# Patient Record
Sex: Female | Born: 1960 | Race: Black or African American | Hispanic: No | Smoking: Never smoker
Health system: Southern US, Community
[De-identification: ages and names within clinical notes are randomized; demographics above are authoritative.]

## PROBLEM LIST (undated history)

## (undated) DIAGNOSIS — J45909 Unspecified asthma, uncomplicated: Secondary | ICD-10-CM

## (undated) DIAGNOSIS — E669 Obesity, unspecified: Secondary | ICD-10-CM

---

## 2011-11-07 ENCOUNTER — Emergency Department (HOSPITAL_COMMUNITY)
Admission: EM | Admit: 2011-11-07 | Discharge: 2011-11-07 | Disposition: A | Discharge status: Home / Self Care | Payer: Self-pay | Attending: Emergency Medicine | Admitting: Emergency Medicine

## 2011-11-07 ENCOUNTER — Encounter (HOSPITAL_COMMUNITY): Payer: Self-pay | Admitting: Emergency Medicine

## 2011-11-07 DIAGNOSIS — R42 Dizziness and giddiness: Secondary | ICD-10-CM | POA: Insufficient documentation

## 2011-11-07 DIAGNOSIS — Z79899 Other long term (current) drug therapy: Secondary | ICD-10-CM | POA: Insufficient documentation

## 2011-11-07 DIAGNOSIS — R110 Nausea: Secondary | ICD-10-CM | POA: Insufficient documentation

## 2011-11-07 DIAGNOSIS — E119 Type 2 diabetes mellitus without complications: Secondary | ICD-10-CM | POA: Insufficient documentation

## 2011-11-07 DIAGNOSIS — R51 Headache: Secondary | ICD-10-CM | POA: Insufficient documentation

## 2011-11-07 DIAGNOSIS — H538 Other visual disturbances: Secondary | ICD-10-CM | POA: Insufficient documentation

## 2011-11-07 DIAGNOSIS — I10 Essential (primary) hypertension: Secondary | ICD-10-CM

## 2011-11-07 HISTORY — DX: Obesity, unspecified: E66.9

## 2011-11-07 LAB — POCT I-STAT, CHEM 8
Calcium, Ion: 1.18 mmol/L (ref 1.12–1.32)
HCT: 39 % (ref 36.0–46.0)
TCO2: 26 mmol/L (ref 0–100)

## 2011-11-07 MED ORDER — METOPROLOL SUCCINATE ER 25 MG PO TB24: 100.0000 mg | ORAL_TABLET | Freq: Every day | ORAL | Status: DC

## 2011-11-07 MED ORDER — HYDROCHLOROTHIAZIDE 25 MG PO TABS: 25.0000 mg | ORAL_TABLET | Freq: Every day | ORAL | Status: DC

## 2011-11-07 NOTE — ED Notes (Signed)
Pt is alert and oriented x 4 with respirations even and unlabored.  NAD at this time.  Discharge instructions reviewed with patient and patient verbalized understanding.  Pt ambulated to lobby with steady gait.

## 2011-11-07 NOTE — ED Notes (Signed)
CSW and pt advocate at bedside.

## 2011-11-07 NOTE — ED Notes (Signed)
ZOX:[id number]<AV> Expected date:<BR> Expected time: 3:14 PM<BR> Means of arrival:<BR> Comments:<BR> M31 - 50yoF Hypertensive earlier, nausea now

## 2011-11-07 NOTE — ED Provider Notes (Signed)
History     CSN: [id number]  Arrival date & time 11/07/11  1514   First MD Initiated Contact with Patient 11/07/11 1537      Chief Complaint  Patient presents with  . Dizziness  . Nausea     HPI Patient presents with dizziness, blurred vision, headache.  She took her blood pressure and was very elevated.  She took her sister blood pressure medicine.  Now she feels back to baseline.  Her blood pressure has decreased.  She does not have a regular physician.  This is a gradual onset of symptoms. Past Medical History  Diagnosis Date  . Obesity   . Diabetes mellitus     diet managed    History reviewed. No pertinent past surgical history.  No family history on file.  History  Substance Use Topics  . Smoking status: Never Smoker   . Smokeless tobacco: Not on file  . Alcohol Use: No    OB History    Grav Para Term Preterm Abortions TAB SAB Ect Mult Living                  Review of Systems Negative except as noted in history of present illness Allergies  Review of patient's allergies indicates no known allergies.  Home Medications   Current Outpatient Rx  Name Route Sig Dispense Refill  . ACETAMINOPHEN 500 MG PO TABS Oral Take 500 mg by mouth every 6 (six) hours as needed. For pain management    . ALBUTEROL SULFATE 1.25 MG/3ML IN NEBU Nebulization Take 1 ampule by nebulization every 6 (six) hours as needed. For breathing treatment    . IPRATROPIUM-ALBUTEROL 18-103 MCG/ACT IN AERO Inhalation Inhale 2 puffs into the lungs every 6 (six) hours as needed. For breathing    . METOPROLOL SUCCINATE ER 100 MG PO TB24 Oral Take 100 mg by mouth daily. Take with or immediately following a meal.    . HYDROCHLOROTHIAZIDE 25 MG PO TABS Oral Take 1 tablet (25 mg total) by mouth daily. 30 tablet 0  . METOPROLOL SUCCINATE ER 25 MG PO TB24 Oral Take 4 tablets (100 mg total) by mouth daily. 30 tablet 0    BP 141/86  Pulse 80  Temp(Src) 98.3 F (36.8 C) (Oral)  Resp 16  Ht 5\' 9"   (1.753 m)  SpO2 100%  LMP 10/24/2011  Physical Exam  Nursing note and vitals reviewed. Constitutional: She is oriented to person, place, and time. She appears well-developed and well-nourished. No distress.  HENT:  Head: Normocephalic and atraumatic.  Eyes: Pupils are equal, round, and reactive to light.       No papilledema noted  Neck: Normal range of motion.  Cardiovascular: Normal rate and intact distal pulses.   Pulmonary/Chest: No respiratory distress.  Abdominal: Normal appearance. She exhibits no distension.  Musculoskeletal: Normal range of motion.  Neurological: She is alert and oriented to person, place, and time. No cranial nerve deficit.  Skin: Skin is warm and dry. No rash noted.  Psychiatric: She has a normal mood and affect. Her behavior is normal.    ED Course  Procedures (including critical care time)  Labs Reviewed  POCT I-STAT, CHEM 8 - Abnormal; Notable for the following:    Glucose, Bld 151 (*)    All other components within normal limits  I-STAT, CHEM 8   No results found.   1. Hypertension       MDM         Nelia Shi,  MD 11/07/11 1746

## 2011-11-07 NOTE — ED Notes (Signed)
Per Pt: BP has been 200+ systolic for past few days; Pt not previously diagnosed with HTN. Took 100mg  of Lopressor from relative at noon. Did have headache and dizziness, now resolved.

## 2012-11-05 ENCOUNTER — Encounter (HOSPITAL_COMMUNITY): Payer: Self-pay | Admitting: Emergency Medicine

## 2012-11-05 ENCOUNTER — Emergency Department (INDEPENDENT_AMBULATORY_CARE_PROVIDER_SITE_OTHER)
Admission: EM | Admit: 2012-11-05 | Discharge: 2012-11-05 | Disposition: A | Discharge status: Home / Self Care | Payer: Self-pay | Source: Home / Self Care

## 2012-11-05 DIAGNOSIS — J45909 Unspecified asthma, uncomplicated: Secondary | ICD-10-CM

## 2012-11-05 DIAGNOSIS — I10 Essential (primary) hypertension: Secondary | ICD-10-CM

## 2012-11-05 HISTORY — DX: Unspecified asthma, uncomplicated: J45.909

## 2012-11-05 MED ORDER — IPRATROPIUM-ALBUTEROL 18-103 MCG/ACT IN AERO: 2.0000 | INHALATION_SPRAY | Freq: Four times a day (QID) | RESPIRATORY_TRACT | Status: DC | PRN

## 2012-11-05 MED ORDER — METOPROLOL TARTRATE 50 MG PO TABS: ORAL_TABLET | ORAL | Status: DC

## 2012-11-05 MED ORDER — BUDESONIDE-FORMOTEROL FUMARATE 80-4.5 MCG/ACT IN AERO: 2.0000 | INHALATION_SPRAY | Freq: Two times a day (BID) | RESPIRATORY_TRACT | Status: DC

## 2012-11-05 NOTE — ED Notes (Signed)
Pt. taken to [facility] to make appointment.

## 2012-11-05 NOTE — ED Notes (Signed)
Out of bp medicine and symbicort

## 2012-11-05 NOTE — ED Provider Notes (Signed)
History     CSN: [id number]  Arrival date & time 11/05/12  1258   None     Chief Complaint  Patient presents with  . 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 She is requesting refills for metoprolol tartrate 50 mg twice a day, Symbicort inhaler and Combivent inhaler. She has a history of hypertension and asthma. She states that she still has some wheezing after she had a cold last week.   Past Medical History  Diagnosis Date  . Obesity   . Diabetes mellitus     diet managed  . Asthma     History reviewed. No pertinent past surgical history.  No family history on file.  History  Substance Use Topics  . Smoking status: Never Smoker   . Smokeless tobacco: Not on file  . Alcohol Use: No    OB History    Grav Para Term Preterm Abortions TAB SAB Ect Mult Living                  Review of Systems  Constitutional: Negative.   HENT: Negative.   Respiratory: Positive for wheezing.   Cardiovascular: Negative for chest pain, palpitations and leg swelling.  Gastrointestinal: Negative.   Genitourinary: Negative.   Hematological: Negative.     Allergies  Review of patient's allergies indicates no known allergies.  Home Medications   Current Outpatient Rx  Name  Route  Sig  Dispense  Refill  . SYMBICORT IN   Inhalation   Inhale into the lungs.         Marland Kitchen HYDROCHLOROTHIAZIDE 25 MG PO TABS   Oral   Take 1 tablet (25 mg total) by mouth daily.   30 tablet   0   . METOPROLOL TARTRATE 50 MG PO TABS   Oral   Take 50 mg by mouth 2 (two) times daily.         . ACETAMINOPHEN 500 MG PO TABS   Oral   Take 500 mg by mouth every 6 (six) hours as needed. For pain management         . ALBUTEROL SULFATE 1.25 MG/3ML IN NEBU   Nebulization   Take 1 ampule  by nebulization every 6 (six) hours as needed. For breathing treatment         . IPRATROPIUM-ALBUTEROL 18-103 MCG/ACT IN AERO   Inhalation   Inhale 2 puffs into the lungs every 6 (six) hours as needed. For breathing         . IPRATROPIUM-ALBUTEROL 18-103 MCG/ACT IN AERO   Inhalation   Inhale 2 puffs into the lungs every 6 (six) hours as needed for wheezing.   1 Inhaler   2   . BUDESONIDE-FORMOTEROL FUMARATE 80-4.5 MCG/ACT IN AERO   Inhalation   Inhale 2 puffs into the lungs 2 (two) times daily.   1 Inhaler   1   . METOPROLOL TARTRATE 50 MG PO TABS      1 tab bid   60 tablet   0   . METOPROLOL SUCCINATE ER 100 MG PO TB24   Oral   Take 100 mg by mouth daily. Take with or immediately following a meal.         . METOPROLOL SUCCINATE ER 25 MG PO TB24   Oral   Take 4 tablets (100 mg total) by mouth daily.  30 tablet   0     BP 136/71  Pulse 91  Temp 98.2 F (36.8 C) (Oral)  Resp 20  SpO2 95%  Physical Exam  Nursing note and vitals reviewed. Constitutional: She is oriented to person, place, and time. She appears well-developed. No distress.  Eyes: Conjunctivae normal and EOM are normal.  Neck: Normal range of motion. Neck supple.  Cardiovascular: Normal rate, regular rhythm and normal heart sounds.   Pulmonary/Chest: Effort normal. She has wheezes. She has no rales.  Musculoskeletal: Normal range of motion. She exhibits no edema.  Lymphadenopathy:    She has no cervical adenopathy.  Neurological: She is alert and oriented to person, place, and time.  Skin: Skin is warm and dry.  Psychiatric: She has a normal mood and affect.    ED Course  Procedures (including critical care time)  Labs Reviewed - No data to display No results found.   1. HTN (hypertension)   2. Asthma, intrinsic       MDM  Process Ms. Deandrade in obtaining an appointment for the adult care center prior to leaving. Rx for metoprolol tartrate 50 mg twice a day #60 Symbicort 2  puffs twice a day Combivent 1-2 puffs every 6 hours when necessary Followup with the PCP as stated above as soon as possible. For any worsening new symptoms or problems may return         Hayden Rasmussen, NP 11/05/12 1513

## 2012-11-06 NOTE — ED Provider Notes (Signed)
Medical screening examination/treatment/procedure(s) were performed by resident physician or non-physician practitioner and as supervising physician I was immediately available for consultation/collaboration.   Orlie Cundari DOUGLAS MD.    Shevon Sian D Orestes Geiman, MD 11/06/12 1058 

## 2012-11-18 ENCOUNTER — Emergency Department (HOSPITAL_COMMUNITY): Payer: Self-pay

## 2012-11-18 ENCOUNTER — Encounter (HOSPITAL_COMMUNITY): Payer: Self-pay | Admitting: *Deleted

## 2012-11-18 ENCOUNTER — Emergency Department (HOSPITAL_COMMUNITY)
Admission: EM | Admit: 2012-11-18 | Discharge: 2012-11-18 | Disposition: A | Discharge status: Home / Self Care | Payer: Self-pay | Attending: Emergency Medicine | Admitting: Emergency Medicine

## 2012-11-18 DIAGNOSIS — R739 Hyperglycemia, unspecified: Secondary | ICD-10-CM

## 2012-11-18 DIAGNOSIS — Z79899 Other long term (current) drug therapy: Secondary | ICD-10-CM | POA: Insufficient documentation

## 2012-11-18 DIAGNOSIS — J40 Bronchitis, not specified as acute or chronic: Secondary | ICD-10-CM

## 2012-11-18 DIAGNOSIS — J45901 Unspecified asthma with (acute) exacerbation: Secondary | ICD-10-CM | POA: Insufficient documentation

## 2012-11-18 DIAGNOSIS — J209 Acute bronchitis, unspecified: Secondary | ICD-10-CM | POA: Insufficient documentation

## 2012-11-18 DIAGNOSIS — E1169 Type 2 diabetes mellitus with other specified complication: Secondary | ICD-10-CM | POA: Insufficient documentation

## 2012-11-18 DIAGNOSIS — E669 Obesity, unspecified: Secondary | ICD-10-CM | POA: Insufficient documentation

## 2012-11-18 LAB — CBC
MCH: 29.3 pg (ref 26.0–34.0)
Platelets: 366 10*3/uL (ref 150–400)
RBC: 4.58 MIL/uL (ref 3.87–5.11)
WBC: 7.8 10*3/uL (ref 4.0–10.5)

## 2012-11-18 LAB — BASIC METABOLIC PANEL
Calcium: 9.5 mg/dL (ref 8.4–10.5)
GFR calc non Af Amer: >90 mL/min (ref >90)
Sodium: 132 mEq/L — ABNORMAL LOW (ref 135–145)

## 2012-11-18 LAB — GLUCOSE, CAPILLARY: Glucose-Capillary: 299 mg/dL — ABNORMAL HIGH (ref 70–99)

## 2012-11-18 MED ORDER — IPRATROPIUM BROMIDE 0.02 % IN SOLN
0.5000 mg | Freq: Once | RESPIRATORY_TRACT | Status: AC
  Administered 2012-11-18: 0.5 mg via RESPIRATORY_TRACT
  Filled 2012-11-18: qty 2.5

## 2012-11-18 MED ORDER — PREDNISONE 20 MG PO TABS
60.0000 mg | ORAL_TABLET | Freq: Once | ORAL | Status: AC
  Administered 2012-11-18: 60 mg via ORAL
  Filled 2012-11-18: qty 3

## 2012-11-18 MED ORDER — IPRATROPIUM-ALBUTEROL 18-103 MCG/ACT IN AERO: 2.0000 | INHALATION_SPRAY | RESPIRATORY_TRACT | Status: DC | PRN

## 2012-11-18 MED ORDER — AZITHROMYCIN 250 MG PO TABS: ORAL_TABLET | ORAL | Status: DC

## 2012-11-18 MED ORDER — INSULIN ASPART 100 UNIT/ML ~~LOC~~ SOLN
6.0000 [IU] | Freq: Once | SUBCUTANEOUS | Status: AC
  Administered 2012-11-18: 6 [IU] via INTRAVENOUS
  Filled 2012-11-18: qty 1

## 2012-11-18 MED ORDER — ALBUTEROL SULFATE (5 MG/ML) 0.5% IN NEBU
5.0000 mg | INHALATION_SOLUTION | Freq: Once | RESPIRATORY_TRACT | Status: AC
  Administered 2012-11-18: 5 mg via RESPIRATORY_TRACT
  Filled 2012-11-18: qty 1

## 2012-11-18 MED ORDER — SODIUM CHLORIDE 0.9 % IV BOLUS (SEPSIS): 1000.0000 mL | Freq: Once | INTRAVENOUS | Status: DC

## 2012-11-18 MED ORDER — PREDNISONE 20 MG PO TABS: ORAL_TABLET | ORAL | Status: DC

## 2012-11-18 MED ORDER — ALBUTEROL SULFATE (2.5 MG/3ML) 0.083% IN NEBU: 2.5000 mg | INHALATION_SOLUTION | Freq: Four times a day (QID) | RESPIRATORY_TRACT | Status: DC | PRN

## 2012-11-18 MED ORDER — AZITHROMYCIN 250 MG PO TABS
500.0000 mg | ORAL_TABLET | Freq: Once | ORAL | Status: AC
  Administered 2012-11-18: 500 mg via ORAL
  Filled 2012-11-18: qty 2

## 2012-11-18 NOTE — ED Notes (Signed)
Patient transported to X-ray 

## 2012-11-18 NOTE — ED Notes (Signed)
Pt with hx of asthma to ED c/o sob, coughing up blood and "fluid", chest tightness/pain since Tues.  Pt only has steroid inhaler at home.  Has run out of albuterol brthng tx.  Exp wheezes noted.

## 2012-11-18 NOTE — ED Notes (Signed)
C/o worsening SOB & wheezing since Monday. Has Hx asthma, reports ran out of albuterol inhaler 1 month ago & med for nebulizer 2 months ago. Also c/o chills, upper back pack pain & chest tightness. Presently resp e/u, no distress. Resp rate 22/min, POX >95% RA

## 2012-11-18 NOTE — ED Provider Notes (Signed)
History     CSN: [id number]  Arrival date & time 11/18/12  1143   First MD Initiated Contact with Patient 11/18/12 1158      Chief Complaint  Patient presents with  . Asthma  . Shortness of Breath    (Consider location/radiation/quality/duration/timing/severity/associated sxs/prior treatment) HPI Elizabeth Leblanc is a 52 y.o. female who presents to ED with complaint of cough, wheezing, chest tightness. Symptoms for 3-4 days, worsened last night. States has hx of asthma. No fever. States using combivent inhaler multiple times a day with no relief. States having pink sputum that she is coughing up. States started with a cold symptoms. Deneis neck pain, stiffness, no other URI symptoms at this time. No leg swelling. Is feeling short of breath.    Past Medical History  Diagnosis Date  . Obesity   . Diabetes mellitus     diet managed  . Asthma     History reviewed. No pertinent past surgical history.  No family history on file.  History  Substance Use Topics  . Smoking status: Never Smoker   . Smokeless tobacco: Not on file  . Alcohol Use: No    OB History    Grav Para Term Preterm Abortions TAB SAB Ect Mult Living                  Review of Systems  Constitutional: Negative for fever and chills.  HENT: Negative for congestion, sore throat, neck pain and neck stiffness.   Respiratory: Positive for cough, chest tightness, shortness of breath and wheezing.   Cardiovascular: Negative for palpitations and leg swelling.  Gastrointestinal: Negative for nausea, vomiting, abdominal pain and diarrhea.  Musculoskeletal: Negative for myalgias.  Skin: Negative.   Neurological: Negative for dizziness, weakness, numbness and headaches.  All other systems reviewed and are negative.    Allergies  Review of patient's allergies indicates no known allergies.  Home Medications   Current Outpatient Rx  Name  Route  Sig  Dispense  Refill  . ACETAMINOPHEN 500 MG PO TABS   Oral    Take 500 mg by mouth every 6 (six) hours as needed. For pain management         . ALBUTEROL SULFATE 1.25 MG/3ML IN NEBU   Nebulization   Take 1 ampule by nebulization every 6 (six) hours as needed. For breathing treatment         . IPRATROPIUM-ALBUTEROL 18-103 MCG/ACT IN AERO   Inhalation   Inhale 2 puffs into the lungs every 6 (six) hours as needed. For breathing         . IPRATROPIUM-ALBUTEROL 18-103 MCG/ACT IN AERO   Inhalation   Inhale 2 puffs into the lungs every 6 (six) hours as needed for wheezing.   1 Inhaler   2   . BUDESONIDE-FORMOTEROL FUMARATE 80-4.5 MCG/ACT IN AERO   Inhalation   Inhale 2 puffs into the lungs 2 (two) times daily.   1 Inhaler   1   . SYMBICORT IN   Inhalation   Inhale into the lungs.         Marland Kitchen HYDROCHLOROTHIAZIDE 25 MG PO TABS   Oral   Take 1 tablet (25 mg total) by mouth daily.   30 tablet   0   . METOPROLOL TARTRATE 50 MG PO TABS   Oral   Take 50 mg by mouth 2 (two) times daily.         Marland Kitchen METOPROLOL TARTRATE 50 MG PO TABS  1 tab bid   60 tablet   0   . METOPROLOL SUCCINATE ER 100 MG PO TB24   Oral   Take 100 mg by mouth daily. Take with or immediately following a meal.         . METOPROLOL SUCCINATE ER 25 MG PO TB24   Oral   Take 4 tablets (100 mg total) by mouth daily.   30 tablet   0     BP 127/80  Pulse 101  Temp 98 F (36.7 C) (Oral)  Resp 22  SpO2 96%  Physical Exam  Nursing note and vitals reviewed. Constitutional: She appears well-developed and well-nourished. No distress.       obese  Eyes: Conjunctivae normal are normal.  Cardiovascular: Normal rate, regular rhythm and normal heart sounds.   Pulmonary/Chest: Effort normal. She has wheezes. She has no rales. She exhibits no tenderness.       Inspiratory and expiratory wheezes bilaterally  Abdominal: Soft. Bowel sounds are normal. She exhibits no distension. There is no tenderness. There is no rebound.  Musculoskeletal: She exhibits no edema.   Neurological: She is alert.  Skin: Skin is warm and dry.    ED Course  Procedures (including critical care time)  Results for orders placed during the hospital encounter of 11/18/12  BASIC METABOLIC PANEL      Component Value Range   Sodium 132 (*) 135 - 145 mEq/L   Potassium 4.0  3.5 - 5.1 mEq/L   Chloride 96  96 - 112 mEq/L   CO2 26  19 - 32 mEq/L   Glucose, Bld 341 (*) 70 - 99 mg/dL   BUN 13  6 - 23 mg/dL   Creatinine, Ser 1.61  0.50 - 1.10 mg/dL   Calcium 9.5  8.4 - 09.6 mg/dL   GFR calc non Af Amer >90  >90 mL/min   GFR calc Af Amer >90  >90 mL/min  CBC      Component Value Range   WBC 7.8  4.0 - 10.5 K/uL   RBC 4.58  3.87 - 5.11 MIL/uL   Hemoglobin 13.4  12.0 - 15.0 g/dL   HCT 04.5  40.9 - 81.1 %   MCV 87.1  78.0 - 100.0 fL   MCH 29.3  26.0 - 34.0 pg   MCHC 33.6  30.0 - 36.0 g/dL   RDW 91.4  78.2 - 95.6 %   Platelets 366  150 - 400 K/uL  GLUCOSE, CAPILLARY      Component Value Range   Glucose-Capillary 299 (*) 70 - 99 mg/dL   Comment 1 Notify RN     Comment 2 Documented in Chart     Dg Chest 2 View (if Patient Has Fever And/or Copd)  11/18/2012  *RADIOLOGY REPORT*  Clinical Data: Asthma with shortness of breath  CHEST - 2 VIEW  Comparison: None.  Findings:  Lungs clear.  Heart size and pulmonary vascularity are normal.  No adenopathy.  No bone lesions.  IMPRESSION: No abnormality noted.   Original Report Authenticated By: Bretta Bang, M.D.      1. Bronchitis   2. Asthma exacerbation   3. Hyperglycemia       MDM  Pt with cough, URI symptoms, wheezing. She has not been taking her inhaler often, because running out. She is coughing, productive cough. Wheezes in all lung fields. Nebs started. CXR negative. Suspect bronchitis. Treated in ED with albuterol neb, prednisone given. Pt feeling better. Her blood sugar elevated at 341.  Pt admits to not taking her metformin the last few days. Explained importance of taking it. Her VS normal at d/c. Got Falecia to come  talk to pt about follow up. Given resources. Given a free prescription for combivent. She is to follow up outpatient.    Filed Vitals:   11/18/12 1153 11/18/12 1220 11/18/12 1415 11/18/12 1515  BP: 127/80 114/73 117/83 120/92  Pulse: 101 103 102 102  Temp: 98 F (36.7 C)  98.3 F (36.8 C)   TempSrc: Oral  Oral   Resp: 22 23 20 19   SpO2: 96% 95% 97% 96%        Lottie Mussel, PA 11/18/12 1540

## 2012-11-22 NOTE — ED Provider Notes (Signed)
Medical screening examination/treatment/procedure(s) were performed by non-physician practitioner and as supervising physician I was immediately available for consultation/collaboration.   Aaniyah Strohm E Dylen Mcelhannon, MD 11/22/12 0752 

## 2012-12-27 ENCOUNTER — Telehealth (HOSPITAL_COMMUNITY): Payer: Self-pay

## 2013-01-14 ENCOUNTER — Emergency Department (INDEPENDENT_AMBULATORY_CARE_PROVIDER_SITE_OTHER)
Admission: EM | Admit: 2013-01-14 | Discharge: 2013-01-14 | Disposition: A | Discharge status: Home / Self Care | Payer: Self-pay | Source: Home / Self Care

## 2013-01-14 ENCOUNTER — Encounter (HOSPITAL_COMMUNITY): Payer: Self-pay

## 2013-01-14 DIAGNOSIS — J45909 Unspecified asthma, uncomplicated: Secondary | ICD-10-CM

## 2013-01-14 LAB — POCT I-STAT, CHEM 8
BUN: 17 mg/dL (ref 6–23)
Chloride: 103 mEq/L (ref 96–112)
Creatinine, Ser: 0.6 mg/dL (ref 0.50–1.10)
Glucose, Bld: 293 mg/dL — ABNORMAL HIGH (ref 70–99)
Potassium: 4.1 mEq/L (ref 3.5–5.1)

## 2013-01-14 MED ORDER — METOPROLOL TARTRATE 25 MG PO TABS: 25.0000 mg | ORAL_TABLET | Freq: Two times a day (BID) | ORAL | Status: DC

## 2013-01-14 MED ORDER — IPRATROPIUM-ALBUTEROL 18-103 MCG/ACT IN AERO: 2.0000 | INHALATION_SPRAY | Freq: Four times a day (QID) | RESPIRATORY_TRACT | Status: AC | PRN

## 2013-01-14 MED ORDER — ALBUTEROL SULFATE 1.25 MG/3ML IN NEBU: 1.0000 | INHALATION_SOLUTION | Freq: Four times a day (QID) | RESPIRATORY_TRACT | Status: DC | PRN

## 2013-01-14 MED ORDER — METFORMIN HCL 1000 MG PO TABS: 1000.0000 mg | ORAL_TABLET | Freq: Two times a day (BID) | ORAL | Status: DC

## 2013-01-14 MED ORDER — PREDNISONE 20 MG PO TABS: ORAL_TABLET | ORAL | Status: AC

## 2013-01-14 MED ORDER — HYDROCHLOROTHIAZIDE 25 MG PO TABS: 25.0000 mg | ORAL_TABLET | Freq: Every day | ORAL | Status: AC

## 2013-01-14 MED ORDER — BUDESONIDE-FORMOTEROL FUMARATE 80-4.5 MCG/ACT IN AERO: 2.0000 | INHALATION_SPRAY | Freq: Two times a day (BID) | RESPIRATORY_TRACT | Status: AC

## 2013-01-14 MED ORDER — ALBUTEROL SULFATE HFA 108 (90 BASE) MCG/ACT IN AERS: 2.0000 | INHALATION_SPRAY | Freq: Four times a day (QID) | RESPIRATORY_TRACT | Status: DC | PRN

## 2013-01-14 NOTE — ED Notes (Signed)
Patient has history of HTN\ Needs medication refill 

## 2013-01-14 NOTE — ED Notes (Addendum)
Patient Demographics  Elizabeth Leblanc, is a 52 y.o. female  ZOX:[id number]  WJX:[id number]  DOB - 1961-07-01  Chief Complaint  Patient presents with  . Hypertension        Subjective:    Elizabeth Leblanc history of hypertension and asthma here for routine followup and to get prescription refills, no headache no chest pain no cough or shortness of breath no abdominal pain or discomfort.    Objective:   Past Medical History  Diagnosis Date  . Obesity   . Diabetes mellitus     diet managed  . Asthma       History reviewed. No pertinent past surgical history.   Filed Vitals:   01/14/13 1558  BP: 125/72  Pulse: 61  Temp: 97.7 F (36.5 C)  TempSrc: Oral  Resp: 16  SpO2: 96%     Exam  Awake Alert, Oriented X 3, No new F.N deficits, Normal affect [place].AT,PERRAL Supple Neck,No JVD, No cervical lymphadenopathy appriciated.  Symmetrical Chest wall movement, Good air movement bilaterally, CTAB RRR,No Gallops,Rubs or new Murmurs, No Parasternal Heave +ve B.Sounds, Abd Soft, Non tender, No organomegaly appriciated, No rebound - guarding or rigidity. No Cyanosis, Clubbing or edema, No new Rash or bruise       Data Review   CBC  Recent Labs Lab 01/14/13 1637  HGB 13.6  HCT 40.0    Chemistries    Recent Labs Lab 01/14/13 1637  NA 137  K 4.1  CL 103  GLUCOSE 293*  BUN 17  CREATININE 0.60   ------------------------------------------------------------------------------------------------------------------ No results found for this basename: HGBA1C,  in the last 72 hours ------------------------------------------------------------------------------------------------------------------ No results found for this basename: CHOL, HDL, LDLCALC, TRIG, CHOLHDL, LDLDIRECT,  in the last 72 hours ------------------------------------------------------------------------------------------------------------------ No results found for this basename: TSH, T4TOTAL,  FREET3, T3FREE, THYROIDAB,  in the last 72 hours ------------------------------------------------------------------------------------------------------------------ No results found for this basename: VITAMINB12, FOLATE, FERRITIN, TIBC, IRON, RETICCTPCT,  in the last 72 hours  Coagulation profile  No results found for this basename: INR, PROTIME,  in the last 168 hours     Prior to Admission medications   Medication Sig Start Date End Date Taking? Authorizing Provider  acetaminophen (TYLENOL) 500 MG tablet Take 500 mg by mouth every 6 (six) hours as needed. For pain management    Historical Provider, MD  albuterol (ACCUNEB) 1.25 MG/3ML nebulizer solution Take 3 mLs (1.25 mg total) by nebulization every 6 (six) hours as needed. For breathing treatment 01/14/13   Leroy Sea, MD  albuterol (PROVENTIL HFA;VENTOLIN HFA) 108 (90 BASE) MCG/ACT inhaler Inhale 2 puffs into the lungs every 6 (six) hours as needed for wheezing. 01/14/13   Leroy Sea, MD  budesonide-formoterol (SYMBICORT) 80-4.5 MCG/ACT inhaler Inhale 2 puffs into the lungs 2 (two) times daily. 01/14/13   Leroy Sea, MD  hydrochlorothiazide (HYDRODIURIL) 25 MG tablet Take 1 tablet (25 mg total) by mouth daily. 01/14/13   Leroy Sea, MD  metoprolol (LOPRESSOR) 25 MG tablet Take 1 tablet (25 mg total) by mouth 2 (two) times daily. 01/14/13   Leroy Sea, MD  predniSONE (DELTASONE) 20 MG tablet Take 5 tab day 1, take 4 tab day 2, take 3 tab day 3, take 2 tab day 4, and take 1 tab day 5.If asthma flares. 01/14/13   Leroy Sea, MD     Assessment & Plan    Hypertension and asthma. Home medications refilled. Prednisone given in case her asthma flares as she is able to  self treated from prior experience. However told that if she develops fevers, shortness of breath which is worse than normal asthma flare come to the ER. Blood pressure is much better and Lopressor dose has been dropped. This is due to regular  exercise.   We checked a baseline BMP is she is on HCTZ for a long time. Her sugars came out to be 295, patient says that she forgot to tell us that she has diabetes mellitus type 2 and she takes thousand milligrams twice a day of metformin, she further says she had just consumed too large soda drinks.   Diabetes mellitus type 2. Continue home dose Metformin (addeed to med list) , patient counseled to check her sugars q. a.c. at bedtime and bring her logbook next visit, she tells me that her sugars are running below 150 at home. As blood work was already done we'll check A1c next visit.    Follow-up Information   Schedule an appointment as soon as possible for a visit in 2 months to follow up. (As needed)        Leroy Sea M.D on 01/14/2013 at 4:43 PM   Leroy Sea, MD 01/14/13 1645  Leroy Sea, MD 01/14/13 [phone]

## 2013-01-21 ENCOUNTER — Telehealth (HOSPITAL_COMMUNITY): Payer: Self-pay | Admitting: *Deleted

## 2013-01-21 NOTE — ED Notes (Signed)
4 calls on VM with no message.  I called back and pt. said she had called about her Rx. of Lopressor 25 mg. BID should be 50 mg. BID done by Dr. Thedore Mins. I asked her was she seen at [facility] or the [facility] and she said the [facility]. She said she wants to complain about the person that called her back today about her Rx. She said, " she started laughing on the phone and I did not think it was funny." She said she had called another time trying to get her Rx. of Albuteral changed and it took a long time to get anyone to take care of it.  I asked her to hold. Came back and gave her Merrilee Seashore phone number [phone].  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 I asked the pt. Her name. She said I should know who I called.  She did not want to give it to me. I told her I needed it to document on her chart. She asked how I knew to call her.  I explained that I had 4 phone messages from her number but no message on the VM. I explained that it was an [facility] VM and if she needs Rx.'s changed to call [phone]. Pt. said she already has that number. I told her the doctor had been given her request and someone would be calling her about the Lopressor Rx. Vassie Moselle 01/21/2013

## 2013-05-25 ENCOUNTER — Telehealth: Payer: Self-pay | Admitting: *Deleted

## 2013-05-25 ENCOUNTER — Other Ambulatory Visit (HOSPITAL_COMMUNITY): Payer: Self-pay | Admitting: Internal Medicine

## 2013-05-25 NOTE — Telephone Encounter (Signed)
05/25/13 Spoke with patient  Requesting refills on blood pressure medications.  Patient made aware that she need to come in to see doctor pt. Stated she does not  Have transportation. P.Noland Pizano,RN BSN [facility]

## 2013-05-27 ENCOUNTER — Telehealth: Payer: Self-pay | Admitting: Family Medicine

## 2013-05-27 MED ORDER — METOPROLOL TARTRATE 25 MG PO TABS: 25.0000 mg | ORAL_TABLET | Freq: Two times a day (BID) | ORAL | Status: DC

## 2013-05-27 MED ORDER — METFORMIN HCL 1000 MG PO TABS: 1000.0000 mg | ORAL_TABLET | Freq: Two times a day (BID) | ORAL | Status: DC

## 2013-05-27 NOTE — Telephone Encounter (Signed)
Pt is completely out of BP medication metoprolol (LOPRESSOR) 25 MG tablet; pt also needs metFORMIN (GLUCOPHAGE) 1000 MG tablet; pt has scripts transferred from [facility] on [address] in [place] to [facility] in [place] on [address]. [phone]

## 2013-09-27 ENCOUNTER — Other Ambulatory Visit: Payer: Self-pay | Admitting: Internal Medicine

## 2013-09-28 ENCOUNTER — Other Ambulatory Visit: Payer: Self-pay

## 2013-09-28 MED ORDER — METOPROLOL TARTRATE 25 MG PO TABS: 25.0000 mg | ORAL_TABLET | Freq: Two times a day (BID) | ORAL | Status: DC

## 2013-09-28 NOTE — Progress Notes (Unsigned)
Patient called stating she ran out of her lopressor Refilled medication for one month and instructed the patient  She needed to make an appointment to be seen Prescription was sent to [facility] on [address] in [place]

## 2013-10-24 ENCOUNTER — Other Ambulatory Visit: Payer: Self-pay | Admitting: Internal Medicine

## 2013-11-01 ENCOUNTER — Telehealth: Payer: Self-pay | Admitting: General Practice

## 2013-11-01 ENCOUNTER — Ambulatory Visit: Payer: Self-pay | Admitting: Internal Medicine

## 2013-11-01 ENCOUNTER — Telehealth: Payer: Self-pay

## 2013-11-01 MED ORDER — METOPROLOL TARTRATE 25 MG PO TABS: 25.0000 mg | ORAL_TABLET | Freq: Two times a day (BID) | ORAL | Status: DC

## 2013-11-01 NOTE — Telephone Encounter (Signed)
Patients appointment had been rescheduled due to dr. Scheduling issue Called in a one month supply of her blood pressure medication because she was running out Has a scheduled appt for this up coming saturday

## 2013-11-01 NOTE — Telephone Encounter (Signed)
We had to reschedule pt's appt due to physician calling out pt says she is running out of bp meds, metoprolol tartrate (LOPRESSOR) 25 MG tablet .  Pt uses [facility] in [place] on [address] phone number, [phone].

## 2013-11-05 ENCOUNTER — Ambulatory Visit: Payer: Self-pay

## 2013-12-20 ENCOUNTER — Other Ambulatory Visit: Payer: Self-pay | Admitting: Internal Medicine

## 2013-12-21 ENCOUNTER — Telehealth: Payer: Self-pay | Admitting: General Practice

## 2013-12-21 MED ORDER — METOPROLOL TARTRATE 25 MG PO TABS: 25.0000 mg | ORAL_TABLET | Freq: Two times a day (BID) | ORAL | Status: AC

## 2013-12-21 MED ORDER — ALBUTEROL SULFATE (2.5 MG/3ML) 0.083% IN NEBU: 2.5000 mg | INHALATION_SOLUTION | Freq: Four times a day (QID) | RESPIRATORY_TRACT | Status: AC | PRN

## 2013-12-21 MED ORDER — METFORMIN HCL 1000 MG PO TABS: 1000.0000 mg | ORAL_TABLET | Freq: Two times a day (BID) | ORAL | Status: AC

## 2013-12-21 NOTE — Telephone Encounter (Signed)
Pt needs refill for medications issue is that she has not been able to establish care at clinic yet because the first scheduled appt was cancelled because the provider called out and she missed the second appt due to car troubles.  Pt has relocated to [place] but has not been able to find a PCP yet but has run out of medications.  Because she received a refill for medication once before pt would like to know if she can receive another until she is able to schedule a PCP appt.

## 2013-12-21 NOTE — Telephone Encounter (Signed)
Spoke with pt regarding medication refill. Pt need bp med/diabetes. Refilled #60,no refills

## 2014-12-24 IMAGING — CR DG CHEST 2V
2 series · 2 of 2 positions shown · non-contrast
Comparison: None.

CLINICAL DATA: Asthma with shortness of breath

CHEST - 2 VIEW

[w chest pa]
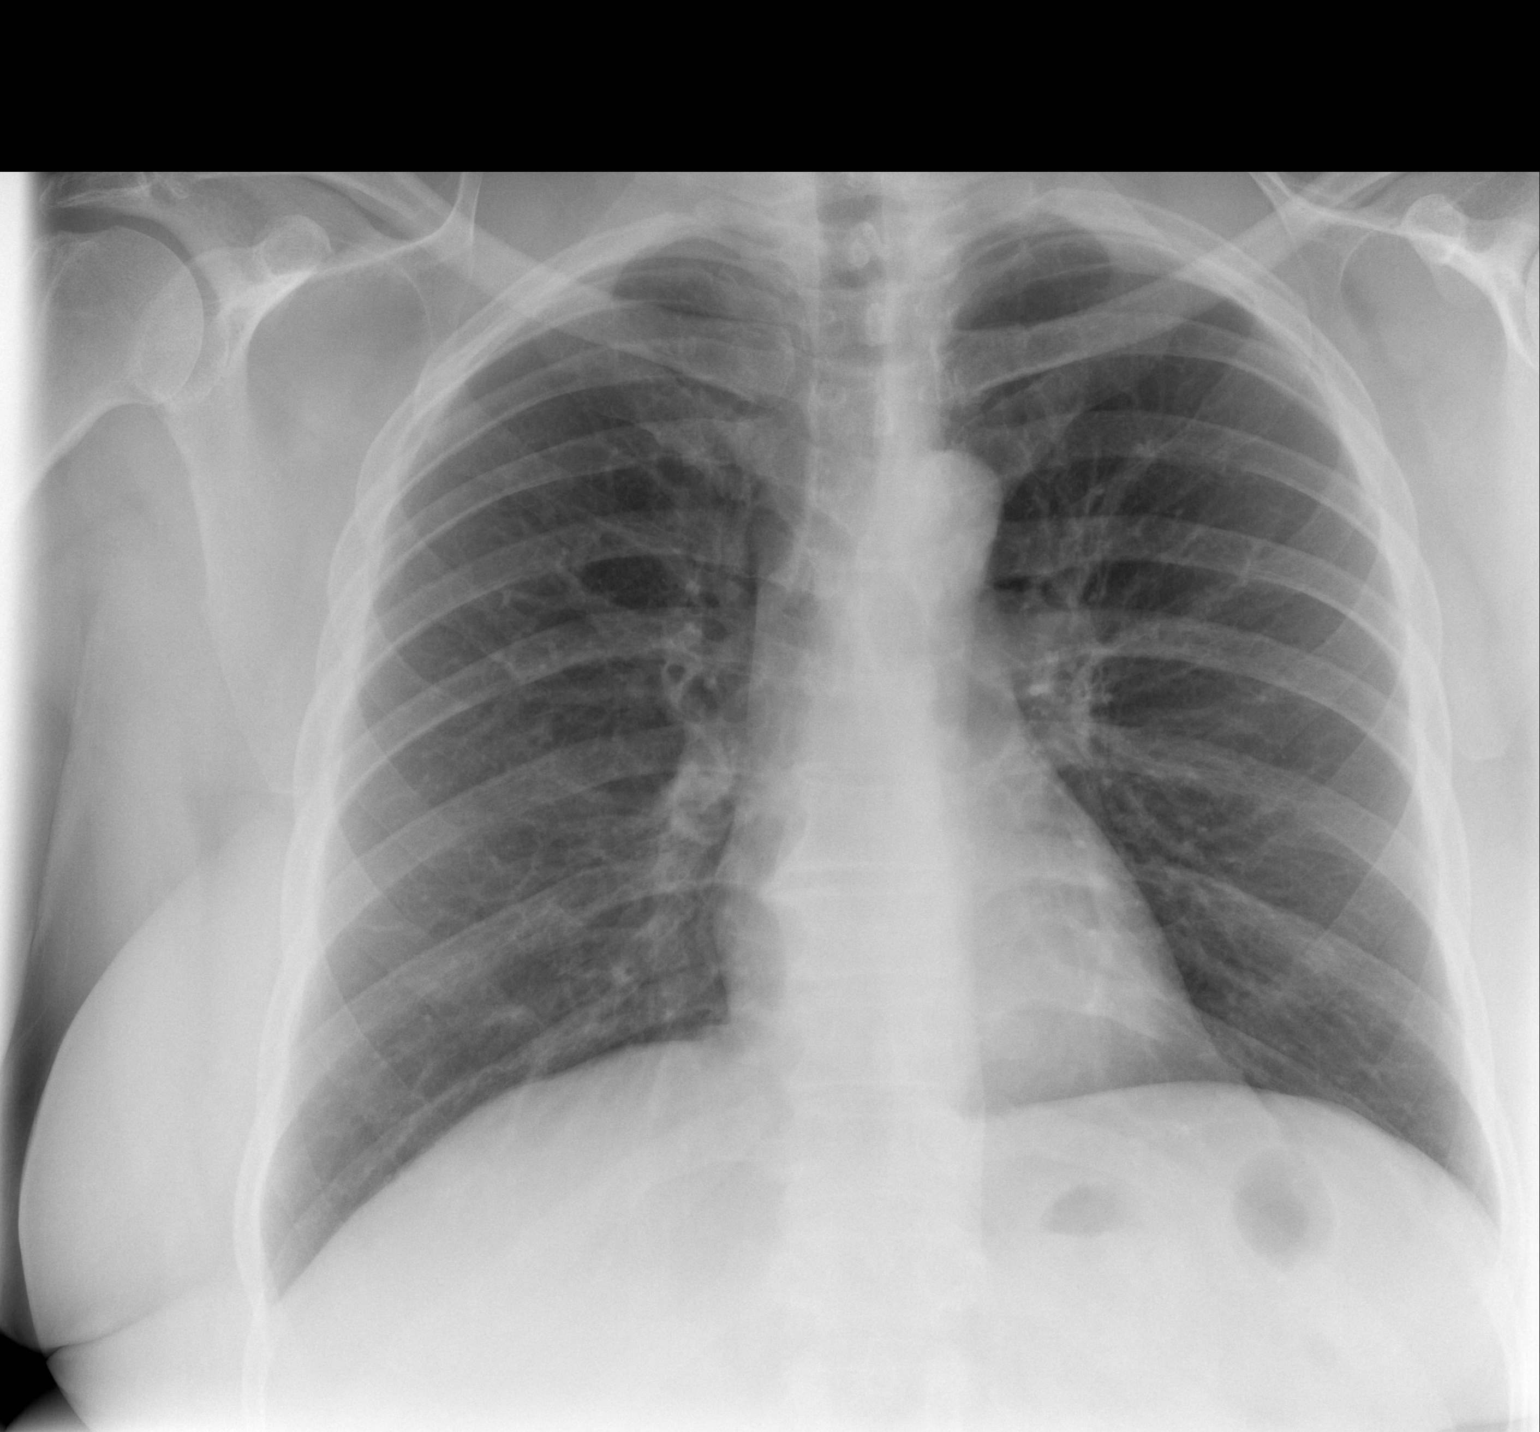

[w chest lat]
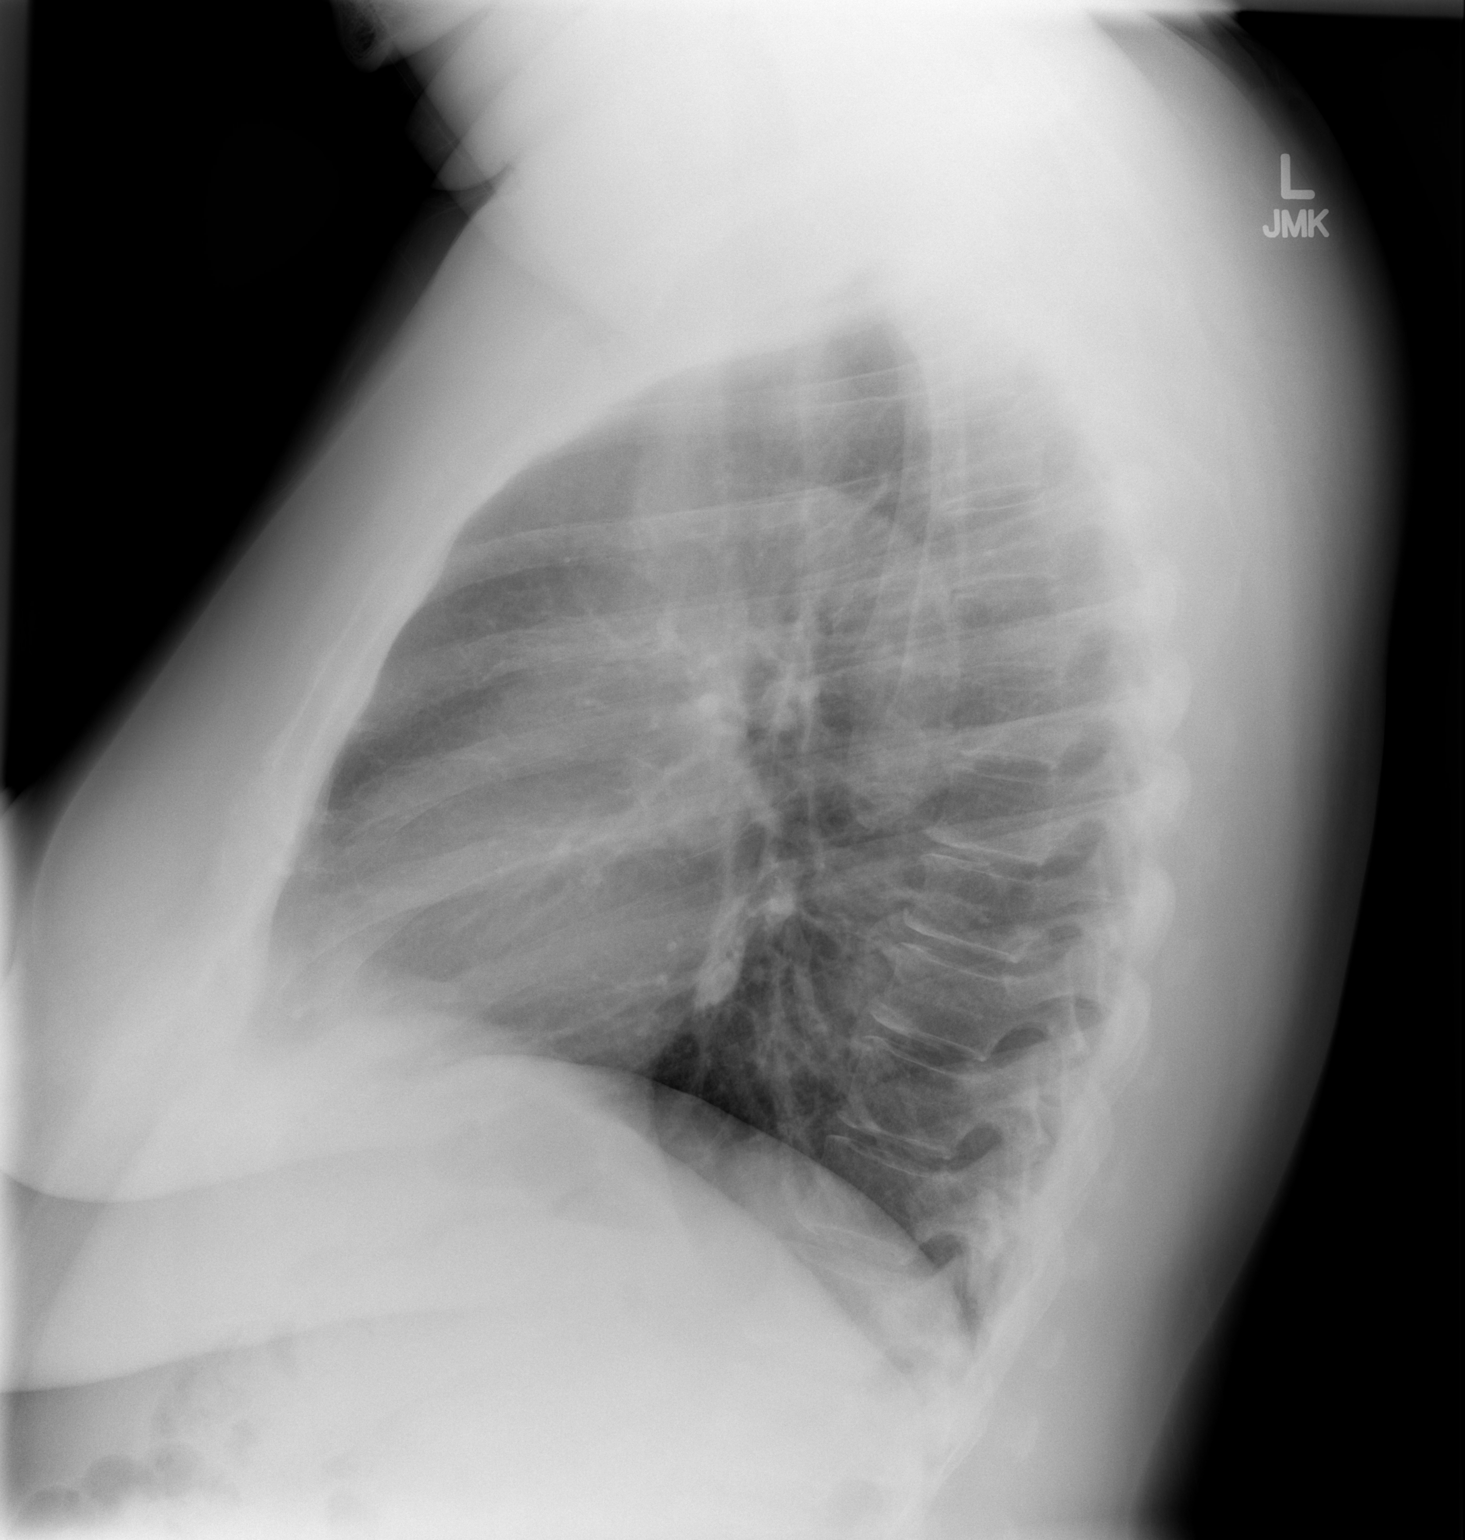

[2 of 2 positions shown; findings below may reference images not displayed]

FINDINGS: Lungs clear.  Heart size and pulmonary vascularity are
normal.  No adenopathy.  No bone lesions.
IMPRESSION: No abnormality noted.

## 2014-12-30 ENCOUNTER — Other Ambulatory Visit: Payer: Self-pay | Admitting: Internal Medicine
